# Patient Record
Sex: Female | Born: 1950 | Race: White | Hispanic: No | Smoking: Current some day smoker
Health system: Southern US, Community
[De-identification: ages and names within clinical notes are randomized; demographics above are authoritative.]

## PROBLEM LIST (undated history)

## (undated) DIAGNOSIS — M1990 Unspecified osteoarthritis, unspecified site: Secondary | ICD-10-CM

## (undated) DIAGNOSIS — W540XXA Bitten by dog, initial encounter: Secondary | ICD-10-CM

## (undated) DIAGNOSIS — B009 Herpesviral infection, unspecified: Secondary | ICD-10-CM

## (undated) DIAGNOSIS — E079 Disorder of thyroid, unspecified: Secondary | ICD-10-CM

## (undated) DIAGNOSIS — R109 Unspecified abdominal pain: Secondary | ICD-10-CM

## (undated) DIAGNOSIS — F4329 Adjustment disorder with other symptoms: Secondary | ICD-10-CM

## (undated) DIAGNOSIS — J3089 Other allergic rhinitis: Secondary | ICD-10-CM

## (undated) DIAGNOSIS — M19019 Primary osteoarthritis, unspecified shoulder: Secondary | ICD-10-CM

## (undated) DIAGNOSIS — M545 Low back pain, unspecified: Secondary | ICD-10-CM

## (undated) DIAGNOSIS — M542 Cervicalgia: Secondary | ICD-10-CM

## (undated) DIAGNOSIS — M25512 Pain in left shoulder: Secondary | ICD-10-CM

## (undated) DIAGNOSIS — L239 Allergic contact dermatitis, unspecified cause: Secondary | ICD-10-CM

## (undated) DIAGNOSIS — R1310 Dysphagia, unspecified: Secondary | ICD-10-CM

## (undated) DIAGNOSIS — C801 Malignant (primary) neoplasm, unspecified: Secondary | ICD-10-CM

## (undated) DIAGNOSIS — S51801A Unspecified open wound of right forearm, initial encounter: Secondary | ICD-10-CM

## (undated) HISTORY — DX: Primary osteoarthritis, unspecified shoulder: M19.019

## (undated) HISTORY — DX: Pain in left shoulder: M25.512

## (undated) HISTORY — PX: ABDOMINAL HYSTERECTOMY: SHX81

## (undated) HISTORY — DX: Low back pain: M54.5

## (undated) HISTORY — DX: Unspecified abdominal pain: R10.9

## (undated) HISTORY — DX: Unspecified open wound of right forearm, initial encounter: S51.801A

## (undated) HISTORY — DX: Other allergic rhinitis: J30.89

## (undated) HISTORY — PX: HERNIA REPAIR: SHX51

## (undated) HISTORY — DX: Herpesviral infection, unspecified: B00.9

## (undated) HISTORY — DX: Dysphagia, unspecified: R13.10

## (undated) HISTORY — DX: Cervicalgia: M54.2

## (undated) HISTORY — DX: Low back pain, unspecified: M54.50

## (undated) HISTORY — DX: Unspecified osteoarthritis, unspecified site: M19.90

## (undated) HISTORY — DX: Allergic contact dermatitis, unspecified cause: L23.9

## (undated) HISTORY — DX: Adjustment disorder with other symptoms: F43.29

## (undated) HISTORY — DX: Malignant (primary) neoplasm, unspecified: C80.1

## (undated) HISTORY — PX: LAPAROSCOPIC BILATERAL SALPINGO OOPHERECTOMY: SHX5890

## (undated) HISTORY — DX: Bitten by dog, initial encounter: W54.0XXA

---

## 1997-09-11 ENCOUNTER — Other Ambulatory Visit: Admission: RE | Admit: 1997-09-11 | Discharge: 1997-09-11 | Payer: Self-pay | Admitting: Obstetrics and Gynecology

## 1998-05-04 ENCOUNTER — Ambulatory Visit (HOSPITAL_BASED_OUTPATIENT_CLINIC_OR_DEPARTMENT_OTHER): Admission: RE | Admit: 1998-05-04 | Discharge: 1998-05-04 | Payer: Self-pay | Admitting: *Deleted

## 1998-09-07 ENCOUNTER — Other Ambulatory Visit: Admission: RE | Admit: 1998-09-07 | Discharge: 1998-09-07 | Payer: Self-pay | Admitting: Obstetrics and Gynecology

## 1999-11-18 ENCOUNTER — Ambulatory Visit (HOSPITAL_BASED_OUTPATIENT_CLINIC_OR_DEPARTMENT_OTHER): Admission: RE | Admit: 1999-11-18 | Discharge: 1999-11-18 | Payer: Self-pay | Admitting: Plastic Surgery

## 1999-12-29 ENCOUNTER — Ambulatory Visit (HOSPITAL_BASED_OUTPATIENT_CLINIC_OR_DEPARTMENT_OTHER): Admission: RE | Admit: 1999-12-29 | Discharge: 1999-12-29 | Payer: Self-pay | Admitting: Plastic Surgery

## 1999-12-29 ENCOUNTER — Encounter (INDEPENDENT_AMBULATORY_CARE_PROVIDER_SITE_OTHER): Payer: Self-pay | Admitting: Specialist

## 2000-09-27 ENCOUNTER — Other Ambulatory Visit: Admission: RE | Admit: 2000-09-27 | Discharge: 2000-09-27 | Payer: Self-pay | Admitting: Obstetrics and Gynecology

## 2001-11-12 ENCOUNTER — Other Ambulatory Visit: Admission: RE | Admit: 2001-11-12 | Discharge: 2001-11-12 | Payer: Self-pay | Admitting: Obstetrics and Gynecology

## 2002-12-18 ENCOUNTER — Other Ambulatory Visit: Admission: RE | Admit: 2002-12-18 | Discharge: 2002-12-18 | Payer: Self-pay | Admitting: Obstetrics and Gynecology

## 2003-12-24 ENCOUNTER — Other Ambulatory Visit: Admission: RE | Admit: 2003-12-24 | Discharge: 2003-12-24 | Payer: Self-pay | Admitting: Obstetrics and Gynecology

## 2005-01-11 ENCOUNTER — Other Ambulatory Visit: Admission: RE | Admit: 2005-01-11 | Discharge: 2005-01-11 | Payer: Self-pay | Admitting: Obstetrics and Gynecology

## 2006-03-03 ENCOUNTER — Ambulatory Visit: Payer: Self-pay | Admitting: Internal Medicine

## 2006-03-07 ENCOUNTER — Ambulatory Visit: Payer: Self-pay | Admitting: Cardiology

## 2010-01-08 ENCOUNTER — Encounter
Admission: RE | Admit: 2010-01-08 | Discharge: 2010-01-08 | Payer: Self-pay | Source: Home / Self Care | Attending: Surgery | Admitting: Surgery

## 2010-05-28 NOTE — Assessment & Plan Note (Signed)
[facility]                             PULMONARY OFFICE NOTE   Gina Petersen, Gina Petersen                        MRN:          [id number]  DATE:03/03/2006                            DOB:          [birth year]    REFERRING PHYSICIAN:  Marjory Lies, M.D.   CHIEF COMPLAINT:  History of cough.   A 60 year old white female with a history of allergies in the spring  and the fall consisting of watery rhinitis which resolved between  seasons and was easily handled with over-the-counter antihistamines. She  said she had these problems as a child and a teenager and early adult,  but outgrew it at some point in her mid-20s. This recurred within the  last year, and now is associated with a hacking cough especially in the  morning but also when she lies down at night for which she has been  tried on multiple regimens. Morning cough typically occurs after she  drinks her coffee and then when she lies down at night daily for the  last two months and never completely better even after antibiotics,  although doxycycline does the best. However, she says as soon as she  finishes antibiotics, it comes back. She denies any present purulent  sputum production, sinus pain, itching, sneezing, wheezing, and does not  have the typical watery rhinitis that was the hallmark of her previous  seasonal rhinitis and never had asthma previously.   PAST MEDICAL HISTORY:  Significant for hepatitis B in the 1980s and  multiple hernia repairs.   ALLERGIES:  PENICILLIN, NONSTEROIDALS.   MEDICATIONS:  1. Climara patch daily one a day .  2. Tussionex p.r.n.   SOCIAL HISTORY:  She occasionally smokes when she goes out with the  girls drinking. 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   FAMILY HISTORY:  Significant for the absence of __________ or asthma.   REVIEW OF SYSTEMS:  Taken in detail and significant for the absence of  additional complaints. She  does have a sensation of a scratchy throat  when she wakes up in the morning.   PHYSICAL EXAMINATION:  GENERAL:  This is a pleasant ambulatory white  female with nasal tone to her voice.  VITAL SIGNS:  Stable.  HEENT:  Reveals mild nonspecific turbinate edema with no excessive  postnasal drainage or cobblestoning.  NECK:  Supple without cervical adenopathy or tenderness. Trachea is  midline. No thyromegaly.  LUNGS:  Lung fields perfectly clear bilaterally to auscultation and  percussion.  HEART:  There is a regular rate and rhythm without murmur, gallop or  rub.  ABDOMEN:  Soft, benign with no palpable organomegaly, masses or  tenderness.  EXTREMITIES:  Warm without calf tenderness, cyanosis, clubbing or edema.   Chest x-ray was performed and is normal.   IMPRESSION:  History of seasonal rhinitis now with chronic rhinitis  symptoms suggesting sinusitis which will be evaluated with sinus CT  scan. I also sense since the cough has been present daily for months  that she has a significant cyclical component. Note that the cough  does  not ever completely resolve even after treatment with antibiotics. This  suggests it is cyclical in component and may be partially fueled by  reflux. Therefore I plan to proceed with a sinus CT scan and hold  antibiotics in the absence of any obvious evidence of sinusitis. Treat  reflux since reflux can actually cause rhinitis with post nasal drip  syndrome with combination of diet and Zegerid 40 mg at bedtime. To  eliminate cyclical cough, I am going to recommend Mucinex DM two every  12 hours. Supplement  with Tramadol 50 mg one every four hours p.r.n.  and six day course of prednisone for any upper inflammatory component.  Followup will be arranged in two weeks to see what extent she has  responded to the above measures before considering additional workup  which might include methacholine challenge test or allergy testing.     Gina Petersen. Gina Sires, MD,  [facility]  Electronically Signed    MBW/MedQ  DD: 03/04/2006  DT: 03/04/2006  Job #: [id number]   cc:   Marjory Lies, M.D.

## 2012-11-12 ENCOUNTER — Ambulatory Visit (INDEPENDENT_AMBULATORY_CARE_PROVIDER_SITE_OTHER): Payer: BC Managed Care – PPO | Admitting: Podiatry

## 2012-11-12 ENCOUNTER — Encounter: Payer: Self-pay | Admitting: Podiatry

## 2012-11-12 VITALS — BP 103/62 | HR 75 | Resp 18

## 2012-11-12 DIAGNOSIS — Q828 Other specified congenital malformations of skin: Secondary | ICD-10-CM

## 2012-11-12 NOTE — Progress Notes (Signed)
°  Subjective:    Patient ID: Gina Petersen, female    DOB: Dec 03, 1950, 62 y.o.   MRN: [id number]  HPI  Trim calluses on the bottom of left foot. Patient presents for ongoing debridement of painful porokeratoses on the left foot  Review of Systems  Constitutional: Negative.   HENT: Negative.   Eyes: Negative.   Respiratory: Negative.   Cardiovascular: Negative.   Gastrointestinal: Negative.   Endocrine: Negative.   Genitourinary: Negative.   Musculoskeletal: Negative.   Skin: Negative.   Allergic/Immunologic: Negative.   Neurological: Negative.   Hematological: Negative.   Psychiatric/Behavioral: Negative.        Objective:   Physical Exam  Nucleated keratoses plantar sub-first and fifth left MPJ noted.        Assessment & Plan:  Assessment: Porokeratoses x2 left foot  Plan: Keratoses are debrided back intact the salinocaine. Reappoint at patient's request.  Richard C.Leeanne Deed, DPM

## 2013-01-21 ENCOUNTER — Ambulatory Visit: Payer: BC Managed Care – PPO | Admitting: Podiatry

## 2013-01-28 ENCOUNTER — Encounter: Payer: Self-pay | Admitting: Podiatry

## 2013-01-28 ENCOUNTER — Ambulatory Visit (INDEPENDENT_AMBULATORY_CARE_PROVIDER_SITE_OTHER): Payer: BC Managed Care – PPO | Admitting: Podiatry

## 2013-01-28 VITALS — BP 128/72 | HR 62 | Resp 18

## 2013-01-28 DIAGNOSIS — Q828 Other specified congenital malformations of skin: Secondary | ICD-10-CM

## 2013-01-28 NOTE — Progress Notes (Signed)
° °  Subjective:    Patient ID: Gina Petersen, female    DOB: 12-Apr-1950, 63 y.o.   MRN: [id number]  HPI I am here to get my callus trimmed up on my left foot. Patient presents at her request for ongoing debridement of painful skin lesions.    Review of Systems     Objective:   Physical Exam Orientated x77  63 year old white female  Nucleated keratoses plantar first and fifth MPJ left.       Assessment & Plan:   Assessment: Porokeratoses x2  Plan: keratoses x2 debrided and packed with salinocaine. Reappoint at patient's request.

## 2013-04-01 ENCOUNTER — Ambulatory Visit: Payer: BC Managed Care – PPO | Admitting: Podiatry

## 2013-04-15 ENCOUNTER — Ambulatory Visit: Payer: BC Managed Care – PPO | Admitting: Podiatry

## 2013-04-22 ENCOUNTER — Encounter: Payer: Self-pay | Admitting: Podiatry

## 2013-04-22 ENCOUNTER — Ambulatory Visit (INDEPENDENT_AMBULATORY_CARE_PROVIDER_SITE_OTHER): Payer: BC Managed Care – PPO | Admitting: Podiatry

## 2013-04-22 VITALS — BP 100/61 | HR 70 | Resp 15 | Ht 68.0 in | Wt 113.0 lb

## 2013-04-22 DIAGNOSIS — Q828 Other specified congenital malformations of skin: Secondary | ICD-10-CM

## 2013-04-22 NOTE — Progress Notes (Signed)
Patient ID: Gina Petersen, female   DOB: 02/11/1950, 63 y.o.   MRN: [id number]  Subjective: This patient presents for ongoing debridement of painful porokeratoses on the left foot.  Objective: Nucleated keratoses plantar first and fifth left MPJ  Assessment: Porokeratoses x2  Plan: Porokeratoses x2 debrided and packed with salinocaine. Reappoint at patient's request

## 2013-06-24 ENCOUNTER — Ambulatory Visit (INDEPENDENT_AMBULATORY_CARE_PROVIDER_SITE_OTHER): Payer: BC Managed Care – PPO

## 2013-06-24 ENCOUNTER — Ambulatory Visit (INDEPENDENT_AMBULATORY_CARE_PROVIDER_SITE_OTHER): Payer: BC Managed Care – PPO | Admitting: Podiatry

## 2013-06-24 ENCOUNTER — Ambulatory Visit: Payer: BC Managed Care – PPO | Admitting: Podiatry

## 2013-06-24 ENCOUNTER — Encounter: Payer: Self-pay | Admitting: Podiatry

## 2013-06-24 VITALS — BP 122/70 | HR 62 | Resp 18

## 2013-06-24 DIAGNOSIS — R609 Edema, unspecified: Secondary | ICD-10-CM

## 2013-06-24 DIAGNOSIS — Q828 Other specified congenital malformations of skin: Secondary | ICD-10-CM

## 2013-06-24 NOTE — Progress Notes (Signed)
   Subjective:    Patient ID: Gina Petersen, female    DOB: 06-Apr-1950, 63 y.o.   MRN: [id number]  HPI I NEED MY CALLUSES TRIMMED UP AND THEY SORE AND TENDER AND HURTS TO WALK ON THEM AND MY LEFT FOOT IS SWELLING AND HAS BEEN GOING ON FOR ABOUT 2 WEEKS AND NO INJURY THAT I NOW OF AND HURTS ON TOP.  The swelling in the left foot reduces slightly in the a.m. She she denies any leg or foot pain in the left foot. She denies any change in her physical activity and direct injury. She denies any other areas of swelling in her body.    Review of Systems     Objective:   Physical Exam Orientated x3 white female  Vascular: DP and PT pulses are 2/4 bilaterally Capillary reflux immediate bilaterally Mild pitting edema dorsum of left foot No calf edema left noted No calf pain left to palpation with dorsi flexion of left foot  Dermatological: Nucleated keratoses plantar first and fifth MPJ left   Neurological: Knee and ankle flex equal and reactive bilaterally  Musculoskeletal: No restriction ankle, subtalar, midtarsal joints bilaterally No tenderness to palpation upon range of motion of the left ankle, midtarsal, MPJ on the left foot. No areas of palpable tenderness left foot/ankle with the exception of the plantar hyperkeratotic lesions  X-ray examination of the left foot ( nonvisible today) demonstrated no fractures and or dislocations noted. No emphysema noted    Assessment & Plan:   Assessment: No evidence of DVT left leg Arterial status within normal limits Edema left foot undetermined origin Porokeratoses x2  Plan: I advised patient that I could not find an obvious cause of her left foot edema. I advised patient to wear a light grade kneelength compression hose left on a daily basis.  I advised patient that if the symptoms worsen or not improve to return.

## 2013-06-25 ENCOUNTER — Encounter: Payer: Self-pay | Admitting: Podiatry

## 2013-08-12 ENCOUNTER — Ambulatory Visit (INDEPENDENT_AMBULATORY_CARE_PROVIDER_SITE_OTHER): Payer: BC Managed Care – PPO | Admitting: Podiatry

## 2013-08-12 ENCOUNTER — Encounter: Payer: Self-pay | Admitting: Podiatry

## 2013-08-12 VITALS — BP 128/68 | HR 60 | Resp 12

## 2013-08-12 DIAGNOSIS — Q828 Other specified congenital malformations of skin: Secondary | ICD-10-CM

## 2013-08-13 NOTE — Progress Notes (Signed)
Patient ID: Gina Petersen, female   DOB: 18-Feb-1950, 63 y.o.   MRN: [id number]  Subjective: This patient presents complaining of painful nucleated keratoses plantar left foot  Objective: Nucleated keratoses plantar left first and fifth MPJ   Assessment: Porokeratosis x2  Plan: Porokeratosis are debrided and packed with salinocaine  Reported patient's request

## 2013-10-07 ENCOUNTER — Encounter: Payer: Self-pay | Admitting: Podiatry

## 2013-10-07 ENCOUNTER — Ambulatory Visit (INDEPENDENT_AMBULATORY_CARE_PROVIDER_SITE_OTHER): Payer: BC Managed Care – PPO | Admitting: Podiatry

## 2013-10-07 VITALS — BP 141/73 | HR 66 | Resp 18

## 2013-10-07 DIAGNOSIS — Q828 Other specified congenital malformations of skin: Secondary | ICD-10-CM

## 2013-10-07 NOTE — Patient Instructions (Signed)
Schedule a point with dermatologist to evaluate skin growth on the right foot

## 2013-10-08 NOTE — Progress Notes (Signed)
Patient ID: Gina Petersen, female   DOB: 08/16/1950, 63 y.o.   MRN: [id number]  Subjective: This patient presents complaining of painful plantar calluses left more than right  Objective: Several millimeter raised keratoses noted dorsum of the right foot Nucleated plantar keratoses first and fifth left  Assessment: Skin lesion dorsum right foot needs further evaluation Porokeratosis 2 left foot  Plan: Patient advised ito have the skin lesion on the dorsum of the right foot evaluated by her dermatologist. She has  ongoing surveillance for low-grade skin cancer ornthe left foot with a dermatologist  Plantar keratoses were debrided and packed with salinocaine on the left  Reappoint at patient's request

## 2013-11-25 ENCOUNTER — Encounter: Payer: Self-pay | Admitting: Podiatry

## 2013-11-25 ENCOUNTER — Ambulatory Visit (INDEPENDENT_AMBULATORY_CARE_PROVIDER_SITE_OTHER): Payer: BC Managed Care – PPO | Admitting: Podiatry

## 2013-11-25 DIAGNOSIS — Q828 Other specified congenital malformations of skin: Secondary | ICD-10-CM

## 2013-11-26 NOTE — Progress Notes (Signed)
Patient ID: Gina Petersen, female   DOB: [birth year], 63 y.o.   MRN: [id number]  Subjective: This patient presents today for for recurrent painful nucleated plantar keratoses  Objective: Nucleated plantar keratoses sub-first and fifth left MPJ  Assessment: Porokeratosis 2  Plan: Debridement plantar keratoses and packed with salinocaine  Reappoint at patient's request

## 2014-02-03 ENCOUNTER — Ambulatory Visit (INDEPENDENT_AMBULATORY_CARE_PROVIDER_SITE_OTHER): Payer: BLUE CROSS/BLUE SHIELD | Admitting: Podiatry

## 2014-02-03 ENCOUNTER — Encounter: Payer: Self-pay | Admitting: Podiatry

## 2014-02-03 DIAGNOSIS — Q828 Other specified congenital malformations of skin: Secondary | ICD-10-CM

## 2014-02-03 NOTE — Progress Notes (Signed)
Patient ID: Gina Petersen, female   DOB: 10/31/1950, 64 y.o.   MRN: [id number]  Subjective: This patient presents again complaining of painful recurrent plantar nucleated keratoses on the left foot  Objective: Nucleated plantar keratoses sub-first and fifth MPJ left  Assessment: Porokeratosis 2  Plan: Debridement of plantar keratoses and packed with salinocaine  Reappoint at patient's request

## 2014-03-31 ENCOUNTER — Ambulatory Visit (INDEPENDENT_AMBULATORY_CARE_PROVIDER_SITE_OTHER): Payer: BLUE CROSS/BLUE SHIELD | Admitting: Podiatry

## 2014-03-31 ENCOUNTER — Encounter: Payer: Self-pay | Admitting: Podiatry

## 2014-03-31 VITALS — BP 114/68 | HR 66 | Resp 12

## 2014-03-31 DIAGNOSIS — Q828 Other specified congenital malformations of skin: Secondary | ICD-10-CM | POA: Diagnosis not present

## 2014-03-31 NOTE — Progress Notes (Signed)
Patient ID: Gina Petersen, female   DOB: 1950/04/13, 64 y.o.   MRN: [id number]  Patient presents today complaining of plantar calluses on the left foot   objective:  nucleated plantar keratoses sub-first and fifth left MPJ   Assessment :  porokeratosis 2   Plan:  debridement of plantar keratoses 2 and packed with salinocaine   Reappoint at patient's request

## 2014-07-02 ENCOUNTER — Encounter: Payer: Self-pay | Admitting: Podiatry

## 2014-07-02 ENCOUNTER — Ambulatory Visit (INDEPENDENT_AMBULATORY_CARE_PROVIDER_SITE_OTHER): Payer: BLUE CROSS/BLUE SHIELD | Admitting: Podiatry

## 2014-07-02 VITALS — BP 125/69 | HR 64 | Resp 12

## 2014-07-02 DIAGNOSIS — Q828 Other specified congenital malformations of skin: Secondary | ICD-10-CM

## 2014-07-03 NOTE — Progress Notes (Signed)
Patient ID: Gina Petersen, female   DOB: 08/14/1950, 64 y.o.   MRN: [id number]  Subjective: This patient present presents today at her request for debridement of painful plantar keratoses on the left foot  Objective: Nucleated plantar keratoses sub-first and fifth MPJ left Minimal keratoses fifth MPJ right  Assessment: Porokeratosis 2 left  Plan: Debride porokeratosis 2 and packed with salinocaine  Reappoint at patient's request

## 2014-08-11 ENCOUNTER — Other Ambulatory Visit: Payer: Self-pay | Admitting: Family Medicine

## 2014-08-11 DIAGNOSIS — R4702 Dysphasia: Secondary | ICD-10-CM

## 2014-08-14 ENCOUNTER — Other Ambulatory Visit: Payer: Self-pay

## 2014-08-14 ENCOUNTER — Ambulatory Visit
Admission: RE | Admit: 2014-08-14 | Discharge: 2014-08-14 | Disposition: A | Discharge status: Home / Self Care | Payer: BLUE CROSS/BLUE SHIELD | Source: Ambulatory Visit | Attending: Family Medicine | Admitting: Family Medicine

## 2014-08-14 DIAGNOSIS — R4702 Dysphasia: Secondary | ICD-10-CM

## 2014-08-15 ENCOUNTER — Other Ambulatory Visit: Payer: Self-pay | Admitting: Family Medicine

## 2014-08-15 DIAGNOSIS — E049 Nontoxic goiter, unspecified: Secondary | ICD-10-CM

## 2014-08-21 ENCOUNTER — Ambulatory Visit
Admission: RE | Admit: 2014-08-21 | Discharge: 2014-08-21 | Disposition: A | Discharge status: Home / Self Care | Payer: BLUE CROSS/BLUE SHIELD | Source: Ambulatory Visit | Attending: Family Medicine | Admitting: Family Medicine

## 2014-08-21 DIAGNOSIS — E049 Nontoxic goiter, unspecified: Secondary | ICD-10-CM

## 2014-08-25 ENCOUNTER — Other Ambulatory Visit: Payer: Self-pay | Admitting: Family Medicine

## 2014-08-25 DIAGNOSIS — E041 Nontoxic single thyroid nodule: Secondary | ICD-10-CM

## 2014-09-16 ENCOUNTER — Other Ambulatory Visit (HOSPITAL_COMMUNITY)
Admission: RE | Admit: 2014-09-16 | Discharge: 2014-09-16 | Disposition: A | Discharge status: Home / Self Care | Payer: BLUE CROSS/BLUE SHIELD | Source: Ambulatory Visit | Attending: Interventional Radiology | Admitting: Interventional Radiology

## 2014-09-16 ENCOUNTER — Ambulatory Visit
Admission: RE | Admit: 2014-09-16 | Discharge: 2014-09-16 | Disposition: A | Discharge status: Home / Self Care | Payer: BLUE CROSS/BLUE SHIELD | Source: Ambulatory Visit | Attending: Family Medicine | Admitting: Family Medicine

## 2014-09-16 DIAGNOSIS — E041 Nontoxic single thyroid nodule: Secondary | ICD-10-CM | POA: Diagnosis not present

## 2014-09-26 ENCOUNTER — Ambulatory Visit (INDEPENDENT_AMBULATORY_CARE_PROVIDER_SITE_OTHER): Payer: BLUE CROSS/BLUE SHIELD | Admitting: Podiatry

## 2014-09-26 ENCOUNTER — Encounter: Payer: Self-pay | Admitting: Podiatry

## 2014-09-26 DIAGNOSIS — Q828 Other specified congenital malformations of skin: Secondary | ICD-10-CM | POA: Diagnosis not present

## 2014-09-26 NOTE — Progress Notes (Signed)
Subjective:     Patient ID: Gina Petersen, female   DOB: 06-03-1950, 64 y.o.   MRN: [id number]  HPIThis patient presents to office saying the calluses are so painful she has difficulty walking.  She says the calluses are treated routinely .  Her left foot has developed painful calluses.   Review of Systems     Objective:   Physical Exam GENERAL APPEARANCE: Alert, conversant. Appropriately groomed. No acute distress.  VASCULAR: Pedal pulses palpable at  [facility] and PT bilateral.  Capillary refill time is immediate to all digits,  Normal temperature gradient.  Digital hair growth is present bilateral  NEUROLOGIC: sensation is normal to 5.07 monofilament at 5/5 sites bilateral.  Light touch is intact bilateral, Muscle strength normal.  MUSCULOSKELETAL: acceptable muscle strength, tone and stability bilateral.  Intrinsic muscluature intact bilateral.  Rectus appearance of foot and digits noted bilateral.   DERMATOLOGIC: skin color, texture, and turgor are within normal limits.  No preulcerative lesions or ulcers  are seen, no interdigital maceration noted.  No open lesions present.  Digital nails are asymptomatic. No drainage noted. Porokeratosis sub tibial sesamoid left foot and sub 5th metatarsal left foot.      Assessment:     Porokeratosis     Plan:     Debridement of porokeratosis.  RTC prn

## 2014-12-02 ENCOUNTER — Other Ambulatory Visit (HOSPITAL_COMMUNITY): Payer: Self-pay | Admitting: Endocrinology

## 2014-12-02 DIAGNOSIS — E0590 Thyrotoxicosis, unspecified without thyrotoxic crisis or storm: Secondary | ICD-10-CM

## 2014-12-08 ENCOUNTER — Encounter (HOSPITAL_COMMUNITY)
Admission: RE | Admit: 2014-12-08 | Discharge: 2014-12-08 | Disposition: A | Discharge status: Home / Self Care | Payer: BLUE CROSS/BLUE SHIELD | Source: Ambulatory Visit | Attending: Endocrinology | Admitting: Endocrinology

## 2014-12-08 DIAGNOSIS — E0590 Thyrotoxicosis, unspecified without thyrotoxic crisis or storm: Secondary | ICD-10-CM

## 2014-12-08 MED ORDER — SODIUM IODIDE I 131 CAPSULE
8.4000 | Freq: Once | INTRAVENOUS | Status: AC | PRN
  Administered 2014-12-08: 8.4 via ORAL

## 2014-12-09 ENCOUNTER — Encounter (HOSPITAL_COMMUNITY)
Admission: RE | Admit: 2014-12-09 | Discharge: 2014-12-09 | Disposition: A | Discharge status: Home / Self Care | Payer: BLUE CROSS/BLUE SHIELD | Source: Ambulatory Visit | Attending: Endocrinology | Admitting: Endocrinology

## 2014-12-09 DIAGNOSIS — E0590 Thyrotoxicosis, unspecified without thyrotoxic crisis or storm: Secondary | ICD-10-CM | POA: Diagnosis present

## 2014-12-09 MED ORDER — SODIUM PERTECHNETATE TC 99M INJECTION
10.8000 | Freq: Once | INTRAVENOUS | Status: AC | PRN
  Administered 2014-12-09: 11 via INTRAVENOUS

## 2014-12-12 ENCOUNTER — Encounter (HOSPITAL_COMMUNITY)
Admission: RE | Admit: 2014-12-12 | Discharge: 2014-12-12 | Disposition: A | Discharge status: Home / Self Care | Payer: BLUE CROSS/BLUE SHIELD | Source: Ambulatory Visit | Attending: Endocrinology | Admitting: Endocrinology

## 2014-12-12 DIAGNOSIS — E0590 Thyrotoxicosis, unspecified without thyrotoxic crisis or storm: Secondary | ICD-10-CM | POA: Diagnosis not present

## 2014-12-12 MED ORDER — SODIUM IODIDE I 131 CAPSULE
29.7000 | Freq: Once | INTRAVENOUS | Status: AC | PRN
  Administered 2014-12-12: 29.7 via ORAL

## 2014-12-15 ENCOUNTER — Inpatient Hospital Stay (HOSPITAL_COMMUNITY): Admission: RE | Admit: 2014-12-15 | Payer: BLUE CROSS/BLUE SHIELD | Source: Ambulatory Visit

## 2015-01-08 ENCOUNTER — Encounter: Payer: Self-pay | Admitting: Podiatry

## 2015-01-08 ENCOUNTER — Ambulatory Visit (INDEPENDENT_AMBULATORY_CARE_PROVIDER_SITE_OTHER): Payer: BLUE CROSS/BLUE SHIELD | Admitting: Podiatry

## 2015-01-08 DIAGNOSIS — Q828 Other specified congenital malformations of skin: Secondary | ICD-10-CM

## 2015-01-08 NOTE — Progress Notes (Signed)
Subjective:     Patient ID: Gina Petersen, female   DOB: 08-24-1950, 64 y.o.   MRN: [id number]  HPIThis patient presents to office saying the calluses are so painful she has difficulty walking.  She says the calluses are treated routinely .  Her left foot has developed painful calluses.   Review of Systems     Objective:   Physical Exam GENERAL APPEARANCE: Alert, conversant. Appropriately groomed. No acute distress.  VASCULAR: Pedal pulses palpable at  [facility] and PT bilateral.  Capillary refill time is immediate to all digits,  Normal temperature gradient.  Digital hair growth is present bilateral  NEUROLOGIC: sensation is normal to 5.07 monofilament at 5/5 sites bilateral.  Light touch is intact bilateral, Muscle strength normal.  MUSCULOSKELETAL: acceptable muscle strength, tone and stability bilateral.  Intrinsic muscluature intact bilateral.  Rectus appearance of foot and digits noted bilateral.   DERMATOLOGIC: skin color, texture, and turgor are within normal limits.  No preulcerative lesions or ulcers  are seen, no interdigital maceration noted.  No open lesions present.  Digital nails are asymptomatic. No drainage noted. Porokeratosis sub tibial sesamoid left foot and sub 5th metatarsal left foot.      Assessment:     Porokeratosis     Plan:     Debridement of porokeratosis.  RTC prn   Gardiner Barefoot DPM

## 2015-04-09 ENCOUNTER — Encounter: Payer: Self-pay | Admitting: Podiatry

## 2015-04-09 ENCOUNTER — Ambulatory Visit (INDEPENDENT_AMBULATORY_CARE_PROVIDER_SITE_OTHER): Payer: BLUE CROSS/BLUE SHIELD | Admitting: Podiatry

## 2015-04-09 DIAGNOSIS — Q828 Other specified congenital malformations of skin: Secondary | ICD-10-CM | POA: Diagnosis not present

## 2015-04-09 NOTE — Progress Notes (Signed)
Subjective:     Patient ID: LATORA STINNETTE, female   DOB: [birth year], 65 y.o.   MRN: [id number]  HPIThis patient presents to office saying the calluses are so painful she has difficulty walking.  She says the calluses are treated routinely .  Her left foot has developed painful calluses.   Review of Systems     Objective:   Physical Exam GENERAL APPEARANCE: Alert, conversant. Appropriately groomed. No acute distress.  VASCULAR: Pedal pulses palpable at  [facility] and PT bilateral.  Capillary refill time is immediate to all digits,  Normal temperature gradient.  Digital hair growth is present bilateral  NEUROLOGIC: sensation is normal to 5.07 monofilament at 5/5 sites bilateral.  Light touch is intact bilateral, Muscle strength normal.  MUSCULOSKELETAL: acceptable muscle strength, tone and stability bilateral.  Intrinsic muscluature intact bilateral.  Rectus appearance of foot and digits noted bilateral.   DERMATOLOGIC: skin color, texture, and turgor are within normal limits.  No preulcerative lesions or ulcers  are seen, no interdigital maceration noted.  No open lesions present.  Digital nails are asymptomatic. No drainage noted. Porokeratosis sub tibial sesamoid left foot and sub 5th metatarsal left foot.      Assessment:     Porokeratosis     Plan:     Debridement of porokeratosis.  RTC prn   Gardiner Barefoot DPM

## 2015-06-11 ENCOUNTER — Ambulatory Visit (INDEPENDENT_AMBULATORY_CARE_PROVIDER_SITE_OTHER): Payer: BLUE CROSS/BLUE SHIELD | Admitting: Podiatry

## 2015-06-11 ENCOUNTER — Encounter: Payer: Self-pay | Admitting: Podiatry

## 2015-06-11 DIAGNOSIS — Q828 Other specified congenital malformations of skin: Secondary | ICD-10-CM

## 2015-06-11 NOTE — Progress Notes (Signed)
Subjective:     Patient ID: ARAEYA DUFFELL, female   DOB: [birth year], 65 y.o.   MRN: [id number]  HPIThis patient presents to office saying the calluses are so painful she has difficulty walking.  She says the calluses are treated routinely .  Her left foot has developed painful calluses.   Review of Systems     Objective:   Physical Exam GENERAL APPEARANCE: Alert, conversant. Appropriately groomed. No acute distress.  VASCULAR: Pedal pulses palpable at  [facility] and PT bilateral.  Capillary refill time is immediate to all digits,  Normal temperature gradient.  Digital hair growth is present bilateral  NEUROLOGIC: sensation is normal to 5.07 monofilament at 5/5 sites bilateral.  Light touch is intact bilateral, Muscle strength normal.  MUSCULOSKELETAL: acceptable muscle strength, tone and stability bilateral.  Intrinsic muscluature intact bilateral.  Rectus appearance of foot and digits noted bilateral.   DERMATOLOGIC: skin color, texture, and turgor are within normal limits.  No preulcerative lesions or ulcers  are seen, no interdigital maceration noted.  No open lesions present.  Digital nails are asymptomatic. No drainage noted. Porokeratosis sub tibial sesamoid left foot and sub 5th metatarsal left foot.      Assessment:     Porokeratosis     Plan:     Debridement of porokeratosis.  RTC prn   Gardiner Barefoot DPM

## 2015-07-29 ENCOUNTER — Ambulatory Visit: Payer: BLUE CROSS/BLUE SHIELD | Admitting: Podiatry

## 2015-07-30 ENCOUNTER — Encounter: Payer: Self-pay | Admitting: Podiatry

## 2015-07-30 ENCOUNTER — Ambulatory Visit (INDEPENDENT_AMBULATORY_CARE_PROVIDER_SITE_OTHER): Payer: Medicare Other | Admitting: Podiatry

## 2015-07-30 DIAGNOSIS — Q828 Other specified congenital malformations of skin: Secondary | ICD-10-CM

## 2015-07-30 NOTE — Progress Notes (Signed)
Subjective:     Patient ID: ZYMIA YACKO, female   DOB: 11-01-1950, 65 y.o.   MRN: [id number]  HPIThis patient presents to office saying the calluses are so painful she has difficulty walking.  She says the calluses are treated routinely .  Her left foot has developed painful calluses.   Review of Systems     Objective:   Physical Exam GENERAL APPEARANCE: Alert, conversant. Appropriately groomed. No acute distress.  VASCULAR: Pedal pulses palpable at  [facility] and PT bilateral.  Capillary refill time is immediate to all digits,  Normal temperature gradient.  Digital hair growth is present bilateral  NEUROLOGIC: sensation is normal to 5.07 monofilament at 5/5 sites bilateral.  Light touch is intact bilateral, Muscle strength normal.  MUSCULOSKELETAL: acceptable muscle strength, tone and stability bilateral.  Intrinsic muscluature intact bilateral.  Rectus appearance of foot and digits noted bilateral.   DERMATOLOGIC: skin color, texture, and turgor are within normal limits.  No preulcerative lesions or ulcers  are seen, no interdigital maceration noted.  No open lesions present.  Digital nails are asymptomatic. No drainage noted. Porokeratosis sub tibial sesamoid left foot and sub 5th metatarsal left foot.      Assessment:     Porokeratosis     Plan:     Debridement of porokeratosis.  RTC prn.  Discussed tibial planing and metatarsal osteotomy for relief of porokeratosis pain.   Gardiner Barefoot DPM

## 2015-11-18 ENCOUNTER — Ambulatory Visit (INDEPENDENT_AMBULATORY_CARE_PROVIDER_SITE_OTHER): Payer: Medicare Other | Admitting: Podiatry

## 2015-11-18 ENCOUNTER — Encounter: Payer: Self-pay | Admitting: Podiatry

## 2015-11-18 VITALS — Resp 16 | Ht 67.5 in | Wt 120.0 lb

## 2015-11-18 DIAGNOSIS — Q828 Other specified congenital malformations of skin: Secondary | ICD-10-CM

## 2015-11-18 NOTE — Progress Notes (Signed)
Subjective:     Patient ID: Gina Petersen, female   DOB: 16-Oct-1950, 65 y.o.   MRN: [id number]  HPIThis patient presents to office saying the calluses are so painful she has difficulty walking.  She says the calluses are treated routinely .  Her left foot has developed painful calluses.   Review of Systems     Objective:   Physical Exam GENERAL APPEARANCE: Alert, conversant. Appropriately groomed. No acute distress.  VASCULAR: Pedal pulses palpable at  [facility] and PT bilateral.  Capillary refill time is immediate to all digits,  Normal temperature gradient.  Digital hair growth is present bilateral  NEUROLOGIC: sensation is normal to 5.07 monofilament at 5/5 sites bilateral.  Light touch is intact bilateral, Muscle strength normal.  MUSCULOSKELETAL: acceptable muscle strength, tone and stability bilateral.  Intrinsic muscluature intact bilateral.  Rectus appearance of foot and digits noted bilateral.   DERMATOLOGIC: skin color, texture, and turgor are within normal limits.  No preulcerative lesions or ulcers  are seen, no interdigital maceration noted.  No open lesions present.  Digital nails are asymptomatic. No drainage noted. Porokeratosis sub tibial sesamoid left foot and sub 5th metatarsal left foot.      Assessment:     Porokeratosis     Plan:     Debridement of porokeratosis.  RTC prn.     Gardiner Barefoot DPM

## 2016-02-22 ENCOUNTER — Other Ambulatory Visit: Payer: Self-pay | Admitting: Obstetrics and Gynecology

## 2016-02-22 DIAGNOSIS — R928 Other abnormal and inconclusive findings on diagnostic imaging of breast: Secondary | ICD-10-CM

## 2016-03-03 ENCOUNTER — Ambulatory Visit (INDEPENDENT_AMBULATORY_CARE_PROVIDER_SITE_OTHER): Payer: Medicare Other | Admitting: Podiatry

## 2016-03-03 ENCOUNTER — Encounter: Payer: Self-pay | Admitting: Podiatry

## 2016-03-03 VITALS — Ht 67.5 in | Wt 120.0 lb

## 2016-03-03 DIAGNOSIS — Q828 Other specified congenital malformations of skin: Secondary | ICD-10-CM

## 2016-03-03 NOTE — Progress Notes (Signed)
Subjective:     Patient ID: Gina Petersen, female   DOB: 11-01-1950, 66 y.o.   MRN: [id number]  HPIThis patient presents to office saying the calluses are so painful she has difficulty walking.  She says the calluses are treated routinely .  Her left foot has developed painful calluses.   Review of Systems     Objective:   Physical Exam GENERAL APPEARANCE: Alert, conversant. Appropriately groomed. No acute distress.  VASCULAR: Pedal pulses palpable at  [facility] and PT bilateral.  Capillary refill time is immediate to all digits,  Normal temperature gradient.  Digital hair growth is present bilateral  NEUROLOGIC: sensation is normal to 5.07 monofilament at 5/5 sites bilateral.  Light touch is intact bilateral, Muscle strength normal.  MUSCULOSKELETAL: acceptable muscle strength, tone and stability bilateral.  Intrinsic muscluature intact bilateral.  Rectus appearance of foot and digits noted bilateral.   DERMATOLOGIC: skin color, texture, and turgor are within normal limits.  No preulcerative lesions or ulcers  are seen, no interdigital maceration noted.  No open lesions present.  Digital nails are asymptomatic. No drainage noted. Porokeratosis sub tibial sesamoid left foot and sub 5th metatarsal left foot.      Assessment:     Porokeratosis     Plan:     Debridement of porokeratosis.  RTC prn.     Gardiner Barefoot DPM

## 2016-04-25 ENCOUNTER — Other Ambulatory Visit: Payer: BLUE CROSS/BLUE SHIELD

## 2016-04-28 ENCOUNTER — Ambulatory Visit
Admission: RE | Admit: 2016-04-28 | Discharge: 2016-04-28 | Disposition: A | Discharge status: Home / Self Care | Payer: Medicare Other | Source: Ambulatory Visit | Attending: Obstetrics and Gynecology | Admitting: Obstetrics and Gynecology

## 2016-04-28 DIAGNOSIS — R928 Other abnormal and inconclusive findings on diagnostic imaging of breast: Secondary | ICD-10-CM

## 2016-06-22 ENCOUNTER — Ambulatory Visit (INDEPENDENT_AMBULATORY_CARE_PROVIDER_SITE_OTHER): Payer: Medicare Other | Admitting: Podiatry

## 2016-06-22 ENCOUNTER — Encounter: Payer: Self-pay | Admitting: Podiatry

## 2016-06-22 DIAGNOSIS — Q828 Other specified congenital malformations of skin: Secondary | ICD-10-CM

## 2016-06-22 NOTE — Progress Notes (Signed)
Subjective:     Patient ID: Gina Petersen, female   DOB: June 29, 1950, 66 y.o.   MRN: [id number]  HPIThis patient presents to office saying the calluses are so painful she has difficulty walking.  She says the calluses are treated routinely .  Her left foot has developed painful calluses.   Review of Systems     Objective:   Physical Exam GENERAL APPEARANCE: Alert, conversant. Appropriately groomed. No acute distress.  VASCULAR: Pedal pulses palpable at  [facility] and PT bilateral.  Capillary refill time is immediate to all digits,  Normal temperature gradient.  Digital hair growth is present bilateral  NEUROLOGIC: sensation is normal to 5.07 monofilament at 5/5 sites bilateral.  Light touch is intact bilateral, Muscle strength normal.  MUSCULOSKELETAL: acceptable muscle strength, tone and stability bilateral.  Intrinsic muscluature intact bilateral.  Rectus appearance of foot and digits noted bilateral.   DERMATOLOGIC: skin color, texture, and turgor are within normal limits.  No preulcerative lesions or ulcers  are seen, no interdigital maceration noted.  No open lesions present.  Digital nails are asymptomatic. No drainage noted. Porokeratosis sub tibial sesamoid left foot and sub 5th metatarsal left foot.      Assessment:     Porokeratosis     Plan:     Debridement of porokeratosis.  RTC prn.     Gardiner Barefoot DPM

## 2016-09-27 ENCOUNTER — Encounter: Payer: Self-pay | Admitting: Podiatry

## 2016-09-27 ENCOUNTER — Ambulatory Visit (INDEPENDENT_AMBULATORY_CARE_PROVIDER_SITE_OTHER): Payer: Medicare Other | Admitting: Podiatry

## 2016-09-27 DIAGNOSIS — Q828 Other specified congenital malformations of skin: Secondary | ICD-10-CM

## 2016-09-27 NOTE — Progress Notes (Signed)
Subjective:     Patient ID: Gina Petersen, female   DOB: 1950/03/28, 66 y.o.   MRN: [id number]  HPIThis patient presents to office saying the calluses are so painful she has difficulty walking.  She says the calluses are treated routinely .  Her left foot has developed painful calluses.   Review of Systems     Objective:   Physical Exam GENERAL APPEARANCE: Alert, conversant. Appropriately groomed. No acute distress.  VASCULAR: Pedal pulses palpable at  [facility] and PT bilateral.  Capillary refill time is immediate to all digits,  Normal temperature gradient.  Digital hair growth is present bilateral  NEUROLOGIC: sensation is normal to 5.07 monofilament at 5/5 sites bilateral.  Light touch is intact bilateral, Muscle strength normal.  MUSCULOSKELETAL: acceptable muscle strength, tone and stability bilateral.  Intrinsic muscluature intact bilateral.  Rectus appearance of foot and digits noted bilateral.   DERMATOLOGIC: skin color, texture, and turgor are within normal limits.  No preulcerative lesions or ulcers  are seen, no interdigital maceration noted.  No open lesions present.  Digital nails are asymptomatic. No drainage noted. Porokeratosis sub tibial sesamoid left foot and sub 5th metatarsal left foot.      Assessment:     Porokeratosis     Plan:     Debridement of porokeratosis.  RTC 10 weeks.     Gardiner Barefoot DPM

## 2016-12-07 ENCOUNTER — Ambulatory Visit: Payer: Medicare Other | Admitting: Podiatry

## 2016-12-19 ENCOUNTER — Ambulatory Visit: Payer: Medicare Other | Admitting: Podiatry

## 2017-04-19 ENCOUNTER — Telehealth: Payer: Self-pay

## 2017-04-19 NOTE — Telephone Encounter (Signed)
SENT REFERRAL TO SCHEDULING 

## 2017-04-27 ENCOUNTER — Other Ambulatory Visit: Payer: Self-pay | Admitting: Gastroenterology

## 2017-04-27 DIAGNOSIS — R1310 Dysphagia, unspecified: Secondary | ICD-10-CM

## 2017-05-03 ENCOUNTER — Encounter: Payer: Self-pay | Admitting: Interventional Cardiology

## 2017-05-10 ENCOUNTER — Ambulatory Visit
Admission: RE | Admit: 2017-05-10 | Discharge: 2017-05-10 | Disposition: A | Discharge status: Home / Self Care | Payer: Medicare Other | Source: Ambulatory Visit | Attending: Gastroenterology | Admitting: Gastroenterology

## 2017-05-10 DIAGNOSIS — R1310 Dysphagia, unspecified: Secondary | ICD-10-CM

## 2017-05-18 ENCOUNTER — Ambulatory Visit: Payer: Medicare Other | Admitting: Interventional Cardiology

## 2017-05-18 ENCOUNTER — Encounter: Payer: Self-pay | Admitting: Interventional Cardiology

## 2017-05-18 VITALS — BP 114/66 | HR 75 | Ht 67.0 in | Wt 118.0 lb

## 2017-05-18 DIAGNOSIS — R002 Palpitations: Secondary | ICD-10-CM | POA: Diagnosis not present

## 2017-05-18 NOTE — Patient Instructions (Addendum)
Medication Instructions:  Your physician recommends that you continue on your current medications as directed. Please refer to the Current Medication list given to you today.  Labwork: None  Testing/Procedures: None  Follow-Up: Your physician recommends that you schedule a follow-up appointment as needed with Dr. Smith.     Any Other Special Instructions Will Be Listed Below (If Applicable).     If you need a refill on your cardiac medications before your next appointment, please call your pharmacy.   

## 2017-05-18 NOTE — Progress Notes (Signed)
Cardiology Office Note    Date:  05/18/2017   ID:  Mearl, Harewood [birth year], MRN [id number]  PCP:  Stephens Shire, MD  Cardiologist: Sinclair Grooms, MD   No chief complaint on file.   History of Present Illness:  Gina Petersen is a 67 y.o. female referred by Dr. Adalberto Ill, PA-C because she hears her heartbeat in the left ear at night and she is having episodes of fluttering heart during the daytime.  She was not very concerned about these complaints but mentioned them to her primary care provider who then referred her here.  She has no prior history of heart disease.  She has never had any prolonged heart racing.  She does state several times per month she will have her heart speed up for seconds and never longer than a minute.  She feels that her heart is racing during that timeframe.  At night when she tries to sleep she is aware of her heart beating although it does not seem to be fast and she can hear it in her left ear.  Physical activity does not cause chest discomfort, racing heart, excessive dyspnea, or leg pain.  She once per week will have a cigarette and a stiff drink of Shearon Stalls.    Past Medical History:  Diagnosis Date  . Abdominal pain   . AC joint arthropathy   . Allergic dermatitis   . Arthritis   . Cancer ([place])   . Dog bite   . Dysphagia   . HSV-1 (herpes simplex virus 1) infection   . Left shoulder pain   . Low back pain   . Neck pain   . Open wound of right forearm   . Osteoarthritis   . Perennial allergic rhinitis   . Stress and adjustment reaction     Past Surgical History:  Procedure Laterality Date  . ABDOMINAL HYSTERECTOMY    . HERNIA REPAIR    . LAPAROSCOPIC BILATERAL SALPINGO OOPHERECTOMY      Current Medications: Outpatient Medications Prior to Visit  Medication Sig Dispense Refill  . acyclovir cream (ZOVIRAX) 5 % Apply 1 application topically 2 (two) times daily as needed (cold sores).     . ALPRAZolam (XANAX) 0.25 MG  tablet Take 0.25 mg by mouth at bedtime as needed for sleep.     Marland Kitchen estradiol (CLIMARA - DOSED IN MG/24 HR) 0.1 mg/24hr patch Place 1 patch onto the skin once a week.    . estradiol (ESTRACE) 0.1 MG/GM vaginal cream Place 1 Applicatorful vaginally at bedtime. Apply one (1) application vaginal twice weekly as needed for dryness and/or leaks.    Marland Kitchen levothyroxine (SYNTHROID, LEVOTHROID) 50 MCG tablet Take 50 mcg by mouth daily before breakfast.    . Multiple Vitamin (MULTI-VITAMINS) TABS Take 1 tablet by mouth daily.     . fluticasone (FLONASE) 50 MCG/ACT nasal spray     . levothyroxine (SYNTHROID, LEVOTHROID) 25 MCG tablet Take 25 mcg by mouth daily before breakfast.     No facility-administered medications prior to visit.      Allergies:   Sulfites; Nsaids; Penicillins; Sulfur; and Benzalkonium chloride   Social History   Socioeconomic History  . Marital status: Divorced    Spouse name: Not on file  . Number of children: Not on file  . Years of education: Not on file  . Highest education level: Not on file  Occupational History  . Not on file  Social Needs  .  Financial resource strain: Not on file  . Food insecurity:    Worry: Not on file    Inability: Not on file  . Transportation needs:    Medical: Not on file    Non-medical: Not on file  Tobacco Use  . Smoking status: Current Some Day Smoker  . Smokeless tobacco: Never Used  Substance and Sexual Activity  . Alcohol use: Yes  . Drug use: No  . Sexual activity: Not on file  Lifestyle  . Physical activity:    Days per week: Not on file    Minutes per session: Not on file  . Stress: Not on file  Relationships  . Social connections:    Talks on phone: Not on file    Gets together: Not on file    Attends religious service: Not on file    Active member of club or organization: Not on file    Attends meetings of clubs or organizations: Not on file    Relationship status: Not on file  Other Topics Concern  . Not on file    Social History Narrative  . Not on file     Family History:  The patient's family history includes Asthma in her maternal aunt; Breast cancer in her cousin and maternal aunt; Lung disease in her father; Stroke in her mother.   ROS:   Please see the history of present illness.    Vision disturbance, easy bruising, snoring, difficulty with balance, and excessive fatigue.  Has a history of thyroid disease.  Thyroid therapy has on prior occasion because her heart rate to increase. All other systems reviewed and are negative.   PHYSICAL EXAM:   VS:  BP 114/66   Pulse 75   Ht 5\' 7"  (1.702 m)   Wt 118 lb (53.5 kg)   BMI 18.48 kg/m    GEN: Well nourished, well developed, in no acute distress he is slender and healthy-appearing. HEENT: normal  Neck: no JVD, carotid bruits, or masses Cardiac: RRR; no murmurs, rubs, or gallops,no edema  Respiratory:  clear to auscultation bilaterally, normal work of breathing GI: soft, nontender, nondistended, + BS MS: no deformity or atrophy  Skin: warm and dry, no rash Neuro:  Alert and Oriented x 3, Strength and sensation are intact Psych: euthymic mood, full affect  Wt Readings from Last 3 Encounters:  05/18/17 118 lb (53.5 kg)  03/03/16 120 lb (54.4 kg)  11/18/15 120 lb (54.4 kg)      Studies/Labs Reviewed:   EKG:  EKG sinus bradycardia but otherwise normal.  Recent Labs: No results found for requested labs within last 8760 hours.   Lipid Panel No results found for: CHOL, TRIG, HDL, CHOLHDL, VLDL, LDLCALC, LDLDIRECT  Additional studies/ records that were reviewed today include:  No cardiac data.  Prior report of EKG from her primary physician is that it is normal.  This was performed April 10th 2000 19.    ASSESSMENT:    1. Palpitations      PLAN:  In order of problems listed above:  1. Etiology is uncertain.  Would like to exclude the possibility of paroxysmal atrial fibrillation.  Recommend 30-day continuous  monitor.    Medication Adjustments/Labs and Tests Ordered: Current medicines are reviewed at length with the patient today.  Concerns regarding medicines are outlined above.  Medication changes, Labs and Tests ordered today are listed in the Patient Instructions below. Patient Instructions  Medication Instructions:  Your physician recommends that you continue on your current  medications as directed. Please refer to the Current Medication list given to you today.  Labwork: None  Testing/Procedures: None   Follow-Up: Your physician recommends that you schedule a follow-up appointment as needed with Dr. Tamala Julian.    Any Other Special Instructions Will Be Listed Below (If Applicable).     If you need a refill on your cardiac medications before your next appointment, please call your pharmacy.      Signed, Sinclair Grooms, MD  05/18/2017 2:41 PM    [address], [place], [place]  [address] Phone: [phone]; Fax: [phone]

## 2017-07-27 ENCOUNTER — Other Ambulatory Visit: Payer: Self-pay

## 2017-07-27 ENCOUNTER — Emergency Department (HOSPITAL_BASED_OUTPATIENT_CLINIC_OR_DEPARTMENT_OTHER)
Admission: EM | Admit: 2017-07-27 | Discharge: 2017-07-27 | Disposition: A | Discharge status: Home / Self Care | Payer: Medicare Other | Attending: Emergency Medicine | Admitting: Emergency Medicine

## 2017-07-27 ENCOUNTER — Encounter (HOSPITAL_BASED_OUTPATIENT_CLINIC_OR_DEPARTMENT_OTHER): Payer: Self-pay | Admitting: Emergency Medicine

## 2017-07-27 DIAGNOSIS — Z79899 Other long term (current) drug therapy: Secondary | ICD-10-CM | POA: Insufficient documentation

## 2017-07-27 DIAGNOSIS — F17200 Nicotine dependence, unspecified, uncomplicated: Secondary | ICD-10-CM | POA: Diagnosis not present

## 2017-07-27 DIAGNOSIS — R319 Hematuria, unspecified: Secondary | ICD-10-CM | POA: Diagnosis present

## 2017-07-27 DIAGNOSIS — Z859 Personal history of malignant neoplasm, unspecified: Secondary | ICD-10-CM | POA: Insufficient documentation

## 2017-07-27 DIAGNOSIS — R899 Unspecified abnormal finding in specimens from other organs, systems and tissues: Secondary | ICD-10-CM | POA: Insufficient documentation

## 2017-07-27 HISTORY — DX: Disorder of thyroid, unspecified: E07.9

## 2017-07-27 LAB — BASIC METABOLIC PANEL
Anion gap: 10 (ref 5–15)
BUN: 18 mg/dL (ref 8–23)
CALCIUM: 8.9 mg/dL (ref 8.9–10.3)
CO2: 26 mmol/L (ref 22–32)
CREATININE: 0.7 mg/dL (ref 0.44–1.00)
Chloride: 105 mmol/L (ref 98–111)
GFR calc Af Amer: >60 mL/min (ref >60)
GLUCOSE: 179 mg/dL — AB (ref 70–99)
POTASSIUM: 3.3 mmol/L — AB (ref 3.5–5.1)
SODIUM: 141 mmol/L (ref 135–145)

## 2017-07-27 LAB — URINALYSIS, ROUTINE W REFLEX MICROSCOPIC
BILIRUBIN URINE: NEGATIVE
GLUCOSE, UA: NEGATIVE mg/dL
KETONES UR: NEGATIVE mg/dL
LEUKOCYTES UA: NEGATIVE
NITRITE: NEGATIVE
PH: 6 (ref 5.0–8.0)
PROTEIN: NEGATIVE mg/dL
Specific Gravity, Urine: 1.025 (ref 1.005–1.030)

## 2017-07-27 LAB — CBC
HCT: 37 % (ref 36.0–46.0)
Hemoglobin: 12.6 g/dL (ref 12.0–15.0)
MCH: 32.2 pg (ref 26.0–34.0)
MCHC: 34.1 g/dL (ref 30.0–36.0)
MCV: 94.6 fL (ref 78.0–100.0)
PLATELETS: 168 10*3/uL (ref 150–400)
RBC: 3.91 MIL/uL (ref 3.87–5.11)
RDW: 13.6 % (ref 11.5–15.5)
WBC: 9.6 10*3/uL (ref 4.0–10.5)

## 2017-07-27 LAB — URINALYSIS, MICROSCOPIC (REFLEX)

## 2017-07-27 NOTE — Discharge Instructions (Addendum)
Follow-up with your primary doctor in the next 2 weeks.  It may be beneficial to repeat a CBC at that time.  Continue doxycycline as previously prescribed.  Return to the ER if symptoms significantly worsen or change.

## 2017-07-27 NOTE — ED Triage Notes (Addendum)
Pt had blood drawn this week due to fatigue due to possible tick bite. She was told to come to the ED for low platelets and low WBC. She has also had blood in her urine

## 2017-07-27 NOTE — ED Provider Notes (Signed)
[facility] EMERGENCY DEPARTMENT Provider Note   CSN: [id number] Arrival date & time: 07/27/17  1550     History   Chief Complaint Chief Complaint  Patient presents with  . Abnormal Lab    HPI Gina Petersen is a 67 y.o. female.  Patient is a 67 year old female with no significant past medical history.  She presents today for evaluation of abnormal laboratory studies.  She was seen by her primary doctor yesterday for fatigue and possible tickborne illness.  She was started on doxycycline, then discharged.  She was called today and told to go to the ER over abnormal laboratory findings.  She was told her white count was 3.2 and her platelet count was 55.  Her only symptoms that she complains of her fatigue and she reports intermittent hematuria that occurs mostly in the morning.  She denies any dysuria, fevers, or chills.  The history is provided by the patient.  Abnormal Lab  Patient referred by:  PCP Resulting agency:  External Resulting agency details:  As above Result type: hematology   Hematology:    Leukocytes:  Low   Platelets:  Low   Past Medical History:  Diagnosis Date  . Abdominal pain   . AC joint arthropathy   . Allergic dermatitis   . Arthritis   . Cancer ([place])   . Dog bite   . Dysphagia   . HSV-1 (herpes simplex virus 1) infection   . Left shoulder pain   . Low back pain   . Neck pain   . Open wound of right forearm   . Osteoarthritis   . Perennial allergic rhinitis   . Stress and adjustment reaction   . Thyroid disease     There are no active problems to display for this patient.   Past Surgical History:  Procedure Laterality Date  . ABDOMINAL HYSTERECTOMY    . HERNIA REPAIR    . LAPAROSCOPIC BILATERAL SALPINGO OOPHERECTOMY       OB History   None      Home Medications    Prior to Admission medications   Medication Sig Start Date End Date Taking? Authorizing Provider  acyclovir cream (ZOVIRAX) 5 % Apply 1 application  topically 2 (two) times daily as needed (cold sores).  03/16/15   [provider]  ALPRAZolam Duanne Moron) 0.25 MG tablet Take 0.25 mg by mouth at bedtime as needed for sleep.  03/16/15   [provider]  estradiol (CLIMARA - DOSED IN MG/24 HR) 0.1 mg/24hr patch Place 1 patch onto the skin once a week.    [provider]  estradiol (ESTRACE) 0.1 MG/GM vaginal cream Place 1 Applicatorful vaginally at bedtime. Apply one (1) application vaginal twice weekly as needed for dryness and/or leaks.    [provider]  levothyroxine (SYNTHROID, LEVOTHROID) 50 MCG tablet Take 50 mcg by mouth daily before breakfast.    [provider]  Multiple Vitamin (MULTI-VITAMINS) TABS Take 1 tablet by mouth daily.     [provider]    Family History Family History  Problem Relation Age of Onset  . Breast cancer Maternal Aunt   . Asthma Maternal Aunt   . Breast cancer Cousin   . Stroke Mother        2x  . Lung disease Father     Social History Social History   Tobacco Use  . Smoking status: Current Some Day Smoker  . Smokeless tobacco: Never Used  Substance Use Topics  .  Alcohol use: Yes  . Drug use: No     Allergies   Sulfites; Nsaids; Penicillins; Sulfur; and Benzalkonium chloride   Review of Systems Review of Systems  All other systems reviewed and are negative.    Physical Exam Updated Vital Signs BP (!) 143/66 (BP Location: Right Arm)   Pulse 78   Temp 98.7 F (37.1 C) (Oral)   Resp 16   Ht 5\' 7"  (1.702 m)   Wt 51.7 kg (114 lb)   SpO2 99%   BMI 17.85 kg/m   Physical Exam  Constitutional: She is oriented to person, place, and time. She appears well-developed and well-nourished. No distress.  HENT:  Head: Normocephalic and atraumatic.  Neck: Normal range of motion. Neck supple.  Cardiovascular: Normal rate and regular rhythm. Exam reveals no gallop and no friction rub.  No murmur heard. Pulmonary/Chest: Effort normal and breath  sounds normal. No respiratory distress. She has no wheezes.  Abdominal: Soft. Bowel sounds are normal. She exhibits no distension. There is no tenderness.  Musculoskeletal: Normal range of motion.  Neurological: She is alert and oriented to person, place, and time.  Skin: Skin is warm and dry. She is not diaphoretic.  Nursing note and vitals reviewed.    ED Treatments / Results  Labs (all labs ordered are listed, but only abnormal results are displayed) Labs Reviewed  BASIC METABOLIC PANEL - Abnormal; Notable for the following components:      Result Value   Potassium 3.3 (*)    Glucose, Bld 179 (*)    All other components within normal limits  URINALYSIS, ROUTINE W REFLEX MICROSCOPIC - Abnormal; Notable for the following components:   Hgb urine dipstick TRACE (*)    All other components within normal limits  URINALYSIS, MICROSCOPIC (REFLEX) - Abnormal; Notable for the following components:   Bacteria, UA FEW (*)    All other components within normal limits  CBC    EKG None  Radiology No results found.  Procedures Procedures (including critical care time)  Medications Ordered in ED Medications - No data to display   Initial Impression / Assessment and Plan / ED Course  I have reviewed the triage vital signs and the nursing notes.  Pertinent labs & imaging results that were available during my care of the patient were reviewed by me and considered in my medical decision making (see chart for details).  Patient's laboratory studies reveal a white count of 9.6 and platelet count of 168, a significant difference from what was obtained yesterday.  I am uncertain as to the discrepancy in these results, however the laboratory at our facility feels as though the specimen was adequate and that the results are accurate.  She has no evidence for UTI or blood in her urine.  She is currently taking doxycycline that was prescribed for the possible tick bite.  I have advised her to  continue taking this and follow-up as needed.  She should have laboratory studies repeated in the next couple of weeks.  Final Clinical Impressions(s) / ED Diagnoses   Final diagnoses:  None    ED Discharge Orders    None       Veryl Speak, MD 07/27/17 1658

## 2017-07-27 NOTE — ED Notes (Signed)
Pt verb understanding of dc intructions

## 2018-08-06 ENCOUNTER — Other Ambulatory Visit: Payer: Self-pay | Admitting: Physician Assistant

## 2018-08-06 DIAGNOSIS — R221 Localized swelling, mass and lump, neck: Secondary | ICD-10-CM

## 2018-08-13 ENCOUNTER — Ambulatory Visit
Admission: RE | Admit: 2018-08-13 | Discharge: 2018-08-13 | Disposition: A | Discharge status: Home / Self Care | Payer: Medicare Other | Source: Ambulatory Visit | Attending: Physician Assistant | Admitting: Physician Assistant

## 2018-08-13 DIAGNOSIS — R221 Localized swelling, mass and lump, neck: Secondary | ICD-10-CM

## 2020-06-10 ENCOUNTER — Other Ambulatory Visit: Payer: Self-pay | Admitting: Otolaryngology

## 2020-06-10 DIAGNOSIS — R431 Parosmia: Secondary | ICD-10-CM

## 2020-06-10 DIAGNOSIS — R432 Parageusia: Secondary | ICD-10-CM

## 2020-12-14 ENCOUNTER — Ambulatory Visit
Admission: RE | Admit: 2020-12-14 | Discharge: 2020-12-14 | Disposition: A | Discharge status: Home / Self Care | Payer: Medicare Other | Source: Ambulatory Visit | Attending: Otolaryngology | Admitting: Otolaryngology

## 2020-12-14 ENCOUNTER — Other Ambulatory Visit: Payer: Self-pay

## 2020-12-14 DIAGNOSIS — R432 Parageusia: Secondary | ICD-10-CM

## 2020-12-14 DIAGNOSIS — R431 Parosmia: Secondary | ICD-10-CM

## 2020-12-14 MED ORDER — GADOBENATE DIMEGLUMINE 529 MG/ML IV SOLN
14.0000 mL | Freq: Once | INTRAVENOUS | Status: AC | PRN
  Administered 2020-12-14: 14 mL via INTRAVENOUS

## 2022-03-03 ENCOUNTER — Other Ambulatory Visit: Payer: Self-pay | Admitting: Obstetrics and Gynecology

## 2022-03-03 DIAGNOSIS — R928 Other abnormal and inconclusive findings on diagnostic imaging of breast: Secondary | ICD-10-CM

## 2022-03-17 ENCOUNTER — Ambulatory Visit
Admission: RE | Admit: 2022-03-17 | Discharge: 2022-03-17 | Disposition: A | Discharge status: Home / Self Care | Payer: HMO | Source: Ambulatory Visit | Attending: Obstetrics and Gynecology | Admitting: Obstetrics and Gynecology

## 2022-03-17 ENCOUNTER — Ambulatory Visit
Admission: RE | Admit: 2022-03-17 | Discharge: 2022-03-17 | Disposition: A | Discharge status: Home / Self Care | Payer: Medicare Other | Source: Ambulatory Visit | Attending: Obstetrics and Gynecology | Admitting: Obstetrics and Gynecology

## 2022-03-17 DIAGNOSIS — R928 Other abnormal and inconclusive findings on diagnostic imaging of breast: Secondary | ICD-10-CM

## 2022-03-18 ENCOUNTER — Other Ambulatory Visit: Payer: Self-pay | Admitting: Obstetrics and Gynecology

## 2022-03-18 DIAGNOSIS — N6489 Other specified disorders of breast: Secondary | ICD-10-CM

## 2022-03-25 ENCOUNTER — Ambulatory Visit
Admission: RE | Admit: 2022-03-25 | Discharge: 2022-03-25 | Disposition: A | Discharge status: Home / Self Care | Payer: HMO | Source: Ambulatory Visit | Attending: Obstetrics and Gynecology | Admitting: Obstetrics and Gynecology

## 2022-03-25 DIAGNOSIS — N6489 Other specified disorders of breast: Secondary | ICD-10-CM

## 2022-03-25 HISTORY — PX: BREAST BIOPSY: SHX20

## 2022-03-28 ENCOUNTER — Other Ambulatory Visit: Payer: Self-pay | Admitting: Obstetrics and Gynecology

## 2022-03-28 DIAGNOSIS — N6489 Other specified disorders of breast: Secondary | ICD-10-CM

## 2022-04-20 ENCOUNTER — Other Ambulatory Visit: Payer: Self-pay | Admitting: Radiology

## 2022-04-20 ENCOUNTER — Ambulatory Visit
Admission: RE | Admit: 2022-04-20 | Discharge: 2022-04-20 | Disposition: A | Discharge status: Home / Self Care | Payer: HMO | Source: Ambulatory Visit | Attending: Obstetrics and Gynecology | Admitting: Obstetrics and Gynecology

## 2022-04-20 DIAGNOSIS — N6489 Other specified disorders of breast: Secondary | ICD-10-CM

## 2022-04-20 HISTORY — PX: BREAST BIOPSY: SHX20

## 2022-11-29 ENCOUNTER — Other Ambulatory Visit: Payer: Self-pay | Admitting: Family Medicine

## 2022-11-29 DIAGNOSIS — Z13820 Encounter for screening for osteoporosis: Secondary | ICD-10-CM

## 2023-04-15 IMAGING — MR MR HEAD WO/W CM
12 series · 48 of 48 positions shown · IV contrast (14ml Multihance)
Comparison: None.

CLINICAL DATA: Parosmia

EXAM:
MRI HEAD WITHOUT AND WITH CONTRAST
TECHNIQUE: Multiplanar, multiecho pulse sequences of the brain and surrounding
structures were obtained without and with intravenous contrast.
CONTRAST:  14mL MULTIHANCE GADOBENATE DIMEGLUMINE 529 MG/ML IV SOLN

[Series 2: T1 · sagittal · 5.0mm · 0.47mm/px · 2 of 24 slices shown]
[im 1/24]
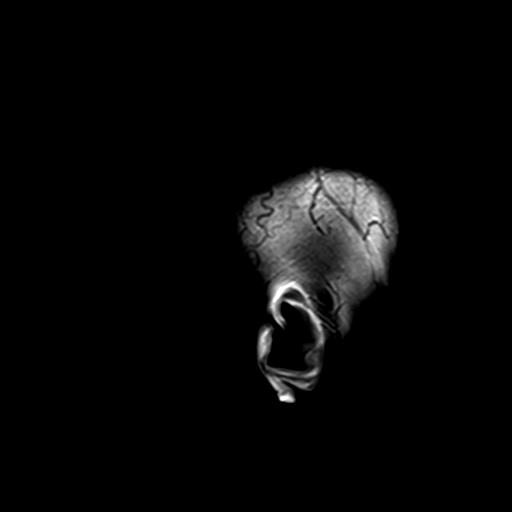
[im 24/24]
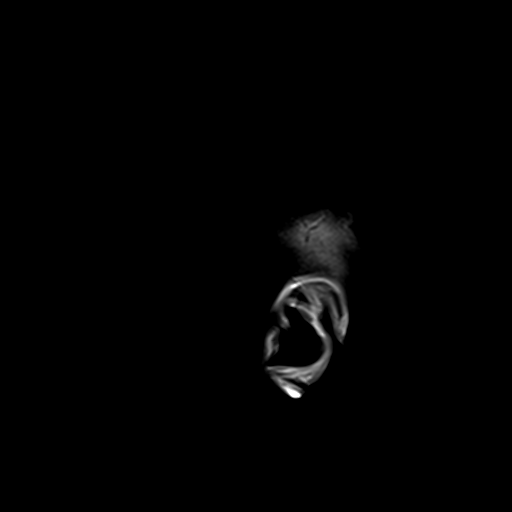

[Series 3: DWI · axial · 3.0mm · 1.80mm/px · z∈[-71,+74]mm · 6 of 99 slices shown (1 of 4)]
[im 1/99]
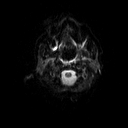
[im 20/99]
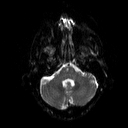
[im 40/99]
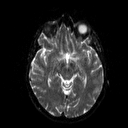
[im 59/99]
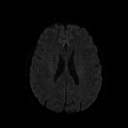
[im 79/99]
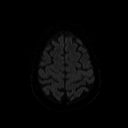
[im 99/99]
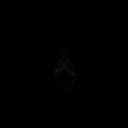

[Series 4: DWI · axial · 3.0mm · 1.80mm/px · z∈[-71,+74]mm · 3 of 50 slices shown (2 of 4)]
[im 1/50]
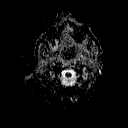
[im 25/50]
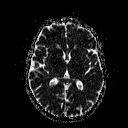
[im 50/50]
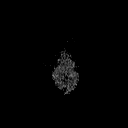

[Series 5: DWI · coronal · 5.0mm · 1.80mm/px · 5 of 71 slices shown (3 of 4)]
[im 1/71]
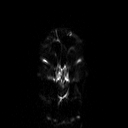
[im 18/71]
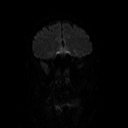
[im 36/71]
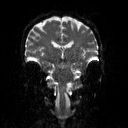
[im 53/71]
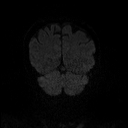
[im 71/71]
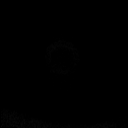

[Series 6: DWI · coronal · 5.0mm · 1.80mm/px · 2 of 36 slices shown (4 of 4)]
[im 1/36]
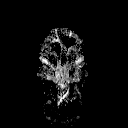
[im 36/36]
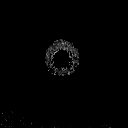

[Series 7: T2 · axial · 5.0mm · 0.72mm/px · 1 of 23 slices shown (1 of 2)]
[im 1/23]
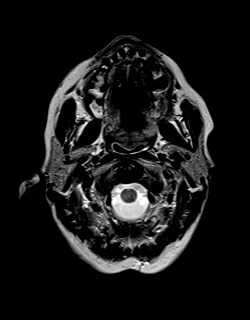

[Series 8: FLAIR · axial · 3.0mm · 0.45mm/px · z∈[-82,+63]mm · 2 of 33 slices shown]
[im 1/33]
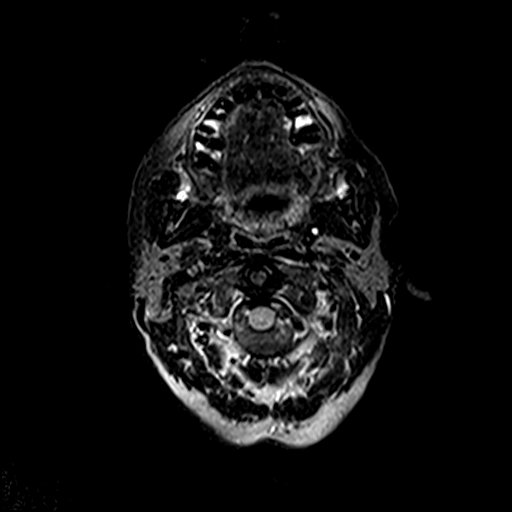
[im 33/33]
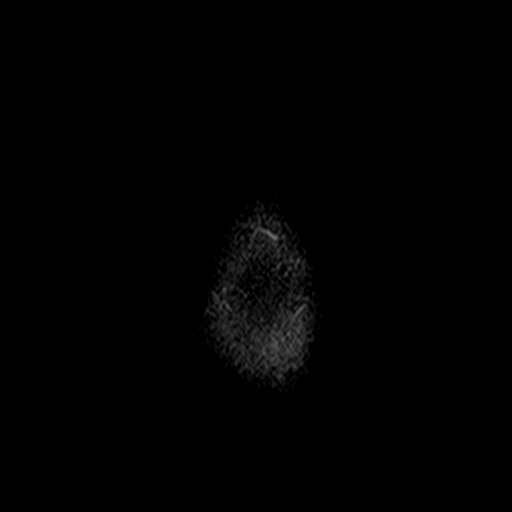

[Series 10: swi_images · axial · 4.0mm · 0.90mm/px · z∈[-86,+66]mm · 3 of 40 slices shown]
[im 1/40]
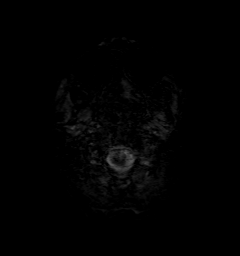
[im 20/40]
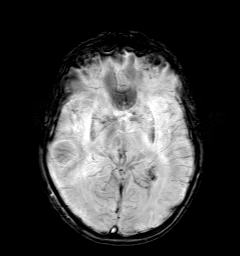
[im 40/40]
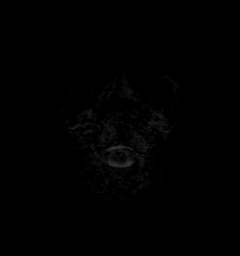

[Series 11: t1_mpr_tra · axial · 1.0mm · 0.75mm/px · z∈[-83,+72]mm · 10 of 160 slices shown]
[im 1/160]
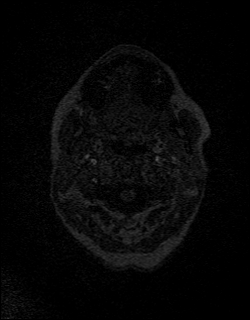
[im 18/160]
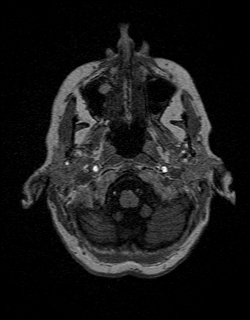
[im 36/160]
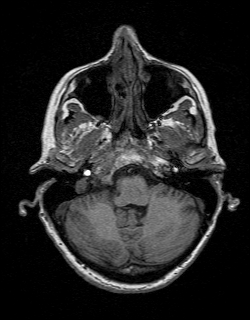
[im 54/160]
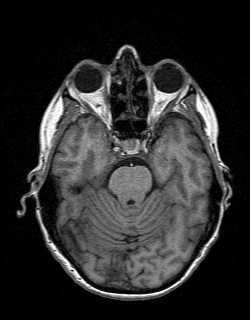
[im 71/160]
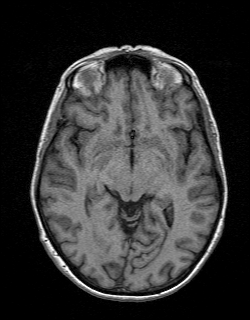
[im 89/160]
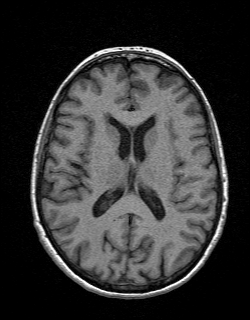
[im 107/160]
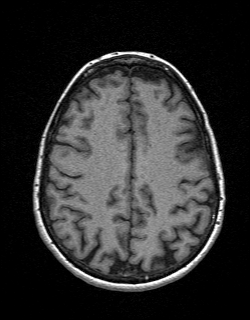
[im 124/160]
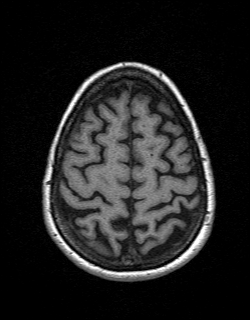
[im 142/160]
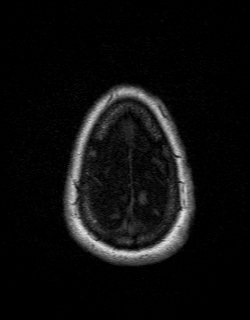
[im 160/160]
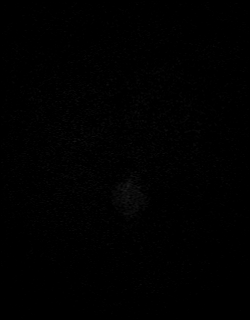

[Series 12: T2 · coronal · 5.0mm · 0.45mm/px · 2 of 28 slices shown (2 of 2)]
[im 1/28]
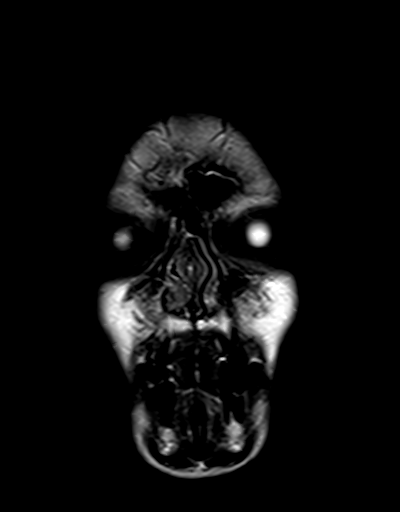
[im 28/28]
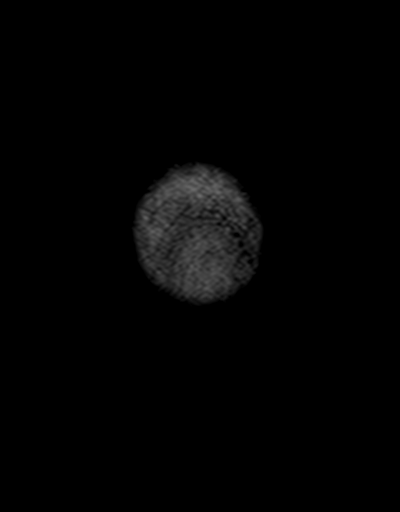

[Series 13: t1_mpr_tra post · axial · 1.0mm · 0.75mm/px · z∈[-83,+72]mm · 10 of 160 slices shown]
[im 1/160]
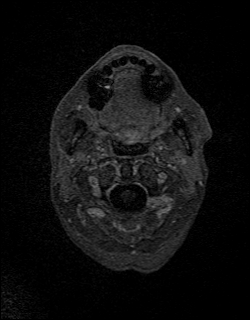
[im 18/160]
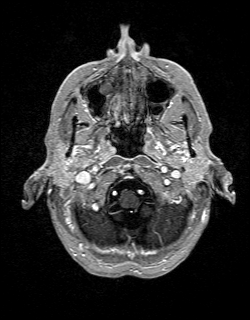
[im 36/160]
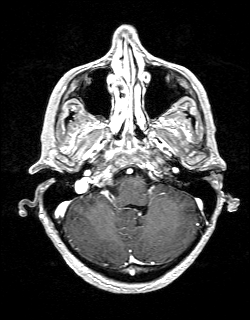
[im 54/160]
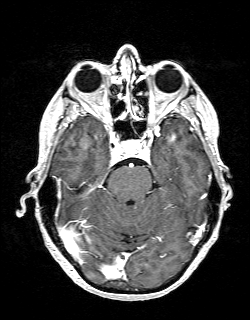
[im 71/160]
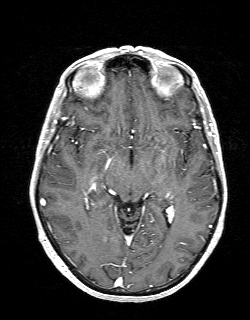
[im 89/160]
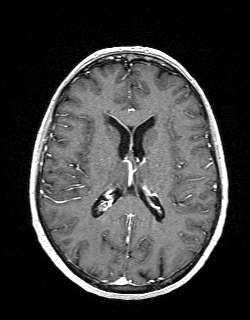
[im 107/160]
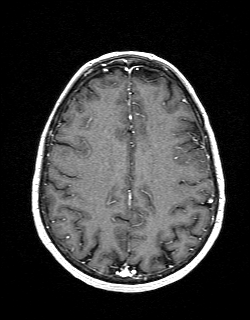
[im 124/160]
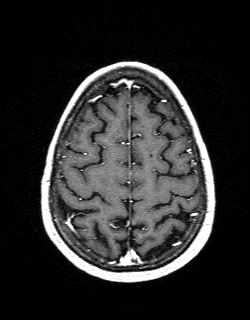
[im 142/160]
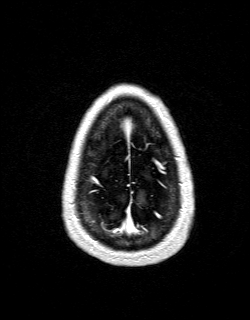
[im 160/160]
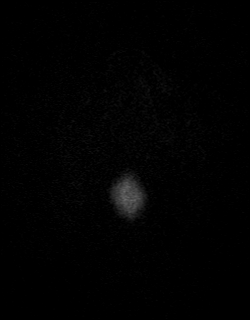

[Series 14: post cor · coronal · 5.0mm · 0.45mm/px · 2 of 28 slices shown]
[im 1/28]
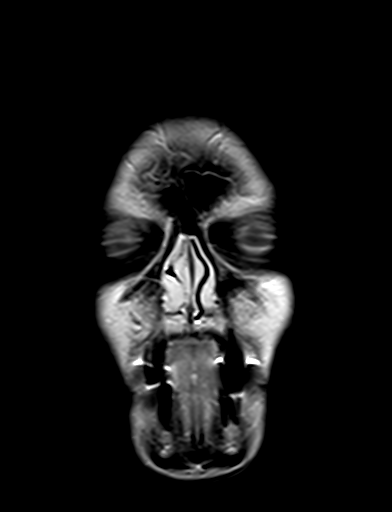
[im 28/28]
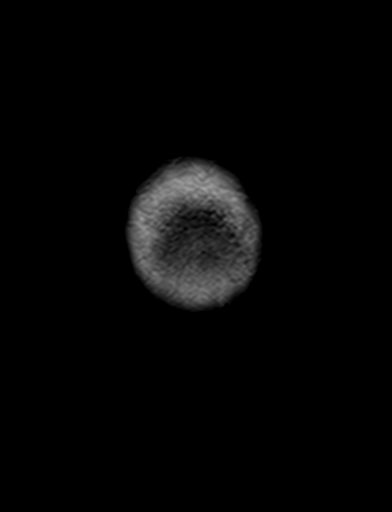

[48 of 48 positions shown; findings below may reference images not displayed]

FINDINGS: Brain: There is no acute infarction or intracranial hemorrhage.
There is no intracranial mass, mass effect, or edema. There is no
hydrocephalus or extra-axial fluid collection. Ventricles and sulci
are within normal limits in size and configuration. Patchy foci of
T2 hyperintensity in the supratentorial white matter are nonspecific
may reflect mild chronic microvascular ischemic changes. There is no
abnormal enhancement.

Vascular: Major vessel flow voids at the skull base are preserved.

Skull and upper cervical spine: Normal marrow signal is preserved.

Sinuses/Orbits: Patchy mucosal thickening. Bilateral lens
replacements.

Other: Sella is unremarkable.  Mastoid air cells are clear.
IMPRESSION: No evidence of recent infarction, hemorrhage, or mass. No abnormal
enhancement.

Probable mild chronic microvascular ischemic changes.

## 2023-12-19 ENCOUNTER — Other Ambulatory Visit: Payer: Self-pay | Admitting: Obstetrics and Gynecology

## 2023-12-19 DIAGNOSIS — Z1239 Encounter for other screening for malignant neoplasm of breast: Secondary | ICD-10-CM

## 2023-12-21 ENCOUNTER — Other Ambulatory Visit: Payer: Self-pay | Admitting: Obstetrics and Gynecology

## 2023-12-21 DIAGNOSIS — Z1239 Encounter for other screening for malignant neoplasm of breast: Secondary | ICD-10-CM

## 2024-02-06 ENCOUNTER — Encounter: Payer: Self-pay | Admitting: Lab
